# Patient Record
Sex: Female | Born: 1945 | Race: White | Hispanic: No | Marital: Married | Smoking: Never smoker
Health system: Southern US, Community
[De-identification: ages and names within clinical notes are randomized; demographics above are authoritative.]

## PROBLEM LIST (undated history)

## (undated) DIAGNOSIS — F32A Depression, unspecified: Secondary | ICD-10-CM

## (undated) DIAGNOSIS — F329 Major depressive disorder, single episode, unspecified: Secondary | ICD-10-CM

## (undated) DIAGNOSIS — E7800 Pure hypercholesterolemia, unspecified: Secondary | ICD-10-CM

## (undated) DIAGNOSIS — I10 Essential (primary) hypertension: Secondary | ICD-10-CM

## (undated) HISTORY — PX: NO PAST SURGERIES: SHX2092

---

## 2015-03-14 ENCOUNTER — Ambulatory Visit
Admission: EM | Admit: 2015-03-14 | Discharge: 2015-03-14 | Disposition: A | Discharge status: Home / Self Care | Payer: Medicare Other | Attending: Family Medicine | Admitting: Family Medicine

## 2015-03-14 ENCOUNTER — Ambulatory Visit: Payer: Medicare Other

## 2015-03-14 DIAGNOSIS — S62624A Displaced fracture of medial phalanx of right ring finger, initial encounter for closed fracture: Secondary | ICD-10-CM | POA: Insufficient documentation

## 2015-03-14 DIAGNOSIS — M79644 Pain in right finger(s): Secondary | ICD-10-CM | POA: Diagnosis present

## 2015-03-14 DIAGNOSIS — S62604A Fracture of unspecified phalanx of right ring finger, initial encounter for closed fracture: Secondary | ICD-10-CM

## 2015-03-14 DIAGNOSIS — W19XXXA Unspecified fall, initial encounter: Secondary | ICD-10-CM | POA: Insufficient documentation

## 2015-03-14 HISTORY — DX: Essential (primary) hypertension: I10

## 2015-03-14 HISTORY — DX: Major depressive disorder, single episode, unspecified: F32.9

## 2015-03-14 HISTORY — DX: Depression, unspecified: F32.A

## 2015-03-14 MED ORDER — HYDROCODONE-ACETAMINOPHEN 5-325 MG PO TABS: 1.0000 | ORAL_TABLET | Freq: Four times a day (QID) | ORAL | Status: DC | PRN

## 2015-03-14 NOTE — Discharge Instructions (Signed)

## 2015-03-14 NOTE — ED Provider Notes (Signed)
CSN: [id number]     Arrival date & time 03/14/15  1229 History   First MD Initiated Contact with Patient 03/14/15 1313     Chief Complaint  Patient presents with  . Fall  . Finger Pain    (Consider location/radiation/quality/duration/timing/severity/associated sxs/prior Treatment) HPI   Is a 69 year old female who is known to our practice who today was visiting her husband is in rehabilitation from a broken hip. States she tripped over her own feet fell onto the ground taking her right fourth finger forcing it into an ulnarward deviation and sustaining a small abrasion on her volar wrist and over the DIP joint radially of the fourth digit. It has a bandage over the DIP joint cause of "bleeding". In addition she has a bandage over the volar proximal hand from an abrasion. She thinks that the finger deviated ulnarward that isn't entirely sure.  Past Medical History  Diagnosis Date  . Depression   . Hypertension    No past surgical history on file. No family history on file. History  Substance Use Topics  . Smoking status: Never Smoker   . Smokeless tobacco: Not on file  . Alcohol Use: Yes     Comment: Occasionally   OB History    No data available     Review of Systems  All other systems reviewed and are negative.   Allergies  Review of patient's allergies indicates no known allergies.  Home Medications   Prior to Admission medications   Medication Sig Start Date End Date Taking? Authorizing Provider  AMLODIPINE BESYLATE PO Take by mouth.   Yes Historical Provider, MD  ATORVASTATIN CALCIUM PO Take by mouth.   Yes Historical Provider, MD  Escitalopram Oxalate (LEXAPRO PO) Take by mouth.   Yes Historical Provider, MD  LISINOPRIL PO Take by mouth.   Yes Historical Provider, MD  HYDROcodone-acetaminophen (NORCO/VICODIN) 5-325 MG per tablet Take 1 tablet by mouth every 6 (six) hours as needed for severe pain. 03/14/15   Chrissie NoaWilliam P Roemer, PA-C   BP 125/51 mmHg  Pulse 80   Temp(Src) 100 F (37.8 C) (Tympanic)  Resp 18  Ht 5\' 2"  (1.575 m)  Wt 240 lb (108.863 kg)  BMI 43.89 kg/m2  SpO2 98% Physical Exam  Constitutional: She is oriented to person, place, and time. She appears well-developed and well-nourished.  HENT:  Head: Normocephalic and atraumatic.  Eyes: Pupils are equal, round, and reactive to light.  Musculoskeletal:  Examination of the right hand shows no forming the of the fourth digit. There is no ecchymosis or swelling present either. There is a small abrasion/laceration over the radial aspect overlying the DIP joint. She has good FDP and FDS of strength and pull-through. There is mild ulnar collateral ligament laxity compared to the left. There is also a small abrasion just proximal to the hypothenar eminence on the volar surface of the right wrist. Is no other identifiable abnormalities. Is not complaining of any other problems.  Neurological: She is alert and oriented to person, place, and time.  Skin: Skin is warm and dry.  Psychiatric: She has a normal mood and affect. Her behavior is normal. Judgment and thought content normal.  Nursing note and vitals reviewed.   ED Course  Procedures (including critical care time) Labs Review Labs Reviewed - No data to display  Imaging Review Dg Finger Ring Right  03/14/2015   CLINICAL DATA:  Pain and swelling and deformity secondary to trauma due to a fall.  EXAM: RIGHT RING  FINGER 2+V  COMPARISON:  None.  FINDINGS: There is a comminuted displaced fracture of the base of the middle phalanx of the ring finger. The fracture extends through the articular surface and the fracture is impacted.  IMPRESSION: Impacted comminuted fracture of the base of the middle phalanx of the ring finger.   Electronically Signed   By: Francene Boyers M.D.   On: 03/14/2015 13:53     MDM   1. Fracture of phalanx of right ring finger, closed, initial encounter    Discharge Medication List as of 03/14/2015  2:22 PM    START  taking these medications   Details  HYDROcodone-acetaminophen (NORCO/VICODIN) 5-325 MG per tablet Take 1 tablet by mouth every 6 (six) hours as needed for severe pain., Starting 03/14/2015, Until Discontinued, Print       Plan: 1. Test/x-ray results and diagnosis reviewed with patient 2. rx as per orders; risks, benefits, potential side effects reviewed with patient 3. Recommend supportive treatment with elevation,ice 4. F/u ortho hand surgery. The patient has an appointment with the hand surgeon [facility] on Monday. She will elevate her hand as much as possible over the weekend. She may apply ice his as well. I've asked her to use ibuprofen for pain and given her some Vicodin to augment the ibuprofen as necessary. She is given a disc of her x-rays for her appointment with the hand surgeon. A ulnar gutter splint was applied to maintain position. Incorporated the ring and little fingers.    Lutricia Feil, PA-C 03/14/15 1439  Lutricia Feil, PA-C 03/14/15 1440

## 2015-03-14 NOTE — ED Notes (Signed)
Fell on tile at noon today/ Reports pain to the right 4th digit finger. Minor cut on the same finger, abrasion to the wrist.

## 2015-09-24 ENCOUNTER — Encounter: Payer: Self-pay | Admitting: *Deleted

## 2015-09-24 ENCOUNTER — Ambulatory Visit
Admission: EM | Admit: 2015-09-24 | Discharge: 2015-09-24 | Disposition: A | Discharge status: Home / Self Care | Payer: Medicare Other | Attending: Family Medicine | Admitting: Family Medicine

## 2015-09-24 ENCOUNTER — Ambulatory Visit: Payer: Medicare Other

## 2015-09-24 DIAGNOSIS — S82892A Other fracture of left lower leg, initial encounter for closed fracture: Secondary | ICD-10-CM | POA: Diagnosis not present

## 2015-09-24 DIAGNOSIS — Y93A1 Activity, exercise machines primarily for cardiorespiratory conditioning: Secondary | ICD-10-CM | POA: Diagnosis not present

## 2015-09-24 DIAGNOSIS — W19XXXA Unspecified fall, initial encounter: Secondary | ICD-10-CM | POA: Insufficient documentation

## 2015-09-24 DIAGNOSIS — I10 Essential (primary) hypertension: Secondary | ICD-10-CM | POA: Insufficient documentation

## 2015-09-24 DIAGNOSIS — S93401A Sprain of unspecified ligament of right ankle, initial encounter: Secondary | ICD-10-CM | POA: Diagnosis not present

## 2015-09-24 DIAGNOSIS — M25572 Pain in left ankle and joints of left foot: Secondary | ICD-10-CM | POA: Diagnosis present

## 2015-09-24 DIAGNOSIS — F329 Major depressive disorder, single episode, unspecified: Secondary | ICD-10-CM | POA: Insufficient documentation

## 2015-09-24 DIAGNOSIS — S93402A Sprain of unspecified ligament of left ankle, initial encounter: Secondary | ICD-10-CM

## 2015-09-24 MED ORDER — HYDROCODONE-ACETAMINOPHEN 5-325 MG PO TABS: 1.0000 | ORAL_TABLET | Freq: Four times a day (QID) | ORAL | Status: AC | PRN

## 2015-09-24 NOTE — Discharge Instructions (Signed)
Ankle Sprain An ankle sprain is an injury to the strong, fibrous tissues (ligaments) that hold your ankle bones together.  HOME CARE   Put ice on your ankle for 1-2 days or as told by your doctor.  Put ice in a plastic bag.  Place a towel between your skin and the bag.  Leave the ice on for 15-20 minutes at a time, every 2 hours while you are awake.  Only take medicine as told by your doctor.  Raise (elevate) your injured ankle above the level of your heart as much as possible for 2-3 days.  Use crutches if your doctor tells you to. Slowly put your own weight on the affected ankle. Use the crutches until you can walk without pain.  If you have a plaster splint:  Do not rest it on anything harder than a pillow for 24 hours.  Do not put weight on it.  Do not get it wet.  Take it off to shower or bathe.  If given, use an elastic wrap or support stocking for support. Take the wrap off if your toes lose feeling (numb), tingle, or turn cold or blue.  If you have an air splint:  Add or let out air to make it comfortable.  Take it off at night and to shower and bathe.  Wiggle your toes and move your ankle up and down often while you are wearing it. GET HELP IF:  You have rapidly increasing bruising or puffiness (swelling).  Your toes feel very cold.  You lose feeling in your foot.  Your medicine does not help your pain. GET HELP RIGHT AWAY IF:   Your toes lose feeling (numb) or turn blue.  You have severe pain that is increasing. MAKE SURE YOU:   Understand these instructions.  Will watch your condition.  Will get help right away if you are not doing well or get worse.   This information is not intended to replace advice given to you by your health care provider. Make sure you discuss any questions you have with your health care provider.   Document Released: 02/02/2008 Document Revised: 09/06/2014 Document Reviewed: 02/28/2012 Elsevier Interactive Patient  Education 2016 [employer]   Distal fibula non-displaced fracture   [facility] [phone] [address]

## 2015-09-24 NOTE — ED Notes (Signed)
Patient twisted left foot on treadmill. Patient has history of left hip disease.

## 2015-09-24 NOTE — ED Provider Notes (Signed)
CSN: [id number]     Arrival date & time 09/24/15  1245 History   First MD Initiated Contact with Patient 09/24/15 1343     Chief Complaint  Patient presents with  . Foot Pain    left side   (Consider location/radiation/quality/duration/timing/severity/associated sxs/prior Treatment) HPI Comments: 70 yo female presents with left ankle pain after falling off a treadmill this morning. Complains of pain to the outside of the ankle with swelling. Has been applying ice to the area and took some Aleve. 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   The history is provided by the patient.    Past Medical History  Diagnosis Date  . Depression   . Hypertension    History reviewed. No pertinent past surgical history. History reviewed. No pertinent family history. Social History  Substance Use Topics  . Smoking status: Never Smoker   . Smokeless tobacco: None  . Alcohol Use: Yes     Comment: Occasionally   OB History    No data available     Review of Systems  Allergies  Review of patient's allergies indicates no known allergies.  Home Medications   Prior to Admission medications   Medication Sig Start Date End Date Taking? Authorizing Provider  AMLODIPINE BESYLATE PO Take by mouth.   Yes Historical Provider, MD  ATORVASTATIN CALCIUM PO Take by mouth.   Yes Historical Provider, MD  Calcium Carb-Cholecalciferol (CALCIUM 1000 + D PO) Take 1 tablet by mouth daily.   Yes Historical Provider, MD  Escitalopram Oxalate (LEXAPRO PO) Take by mouth.   Yes Historical Provider, MD  LISINOPRIL PO Take by mouth.   Yes Historical Provider, MD  vitamin B-12 (CYANOCOBALAMIN) 500 MCG tablet Take 500 mcg by mouth daily.   Yes Historical Provider, MD  denosumab (PROLIA) 60 MG/ML SOLN injection Inject 60 mg into the skin every 6 (six) months. Administer in upper arm, thigh, or abdomen    Historical Provider, MD  HYDROcodone-acetaminophen (NORCO/VICODIN) 5-325 MG tablet Take 1-2 tablets by mouth every 6 (six)  hours as needed. 09/24/15   Payton Mccallum, MD   Meds Ordered and Administered this Visit  Medications - No data to display  BP 139/83 mmHg  Pulse 98  Temp(Src) 97.5 F (36.4 C) (Oral)  Resp 18  Ht  (1.6 m)  Wt 255 lb (115.667 kg)  BMI 45.18 kg/m2  SpO2 99% No data found.   Physical Exam  Constitutional: She appears well-developed and well-nourished. No distress.  Musculoskeletal:       Left ankle: She exhibits swelling. She exhibits normal range of motion, no ecchymosis, no deformity, no laceration and normal pulse. Tenderness. Lateral malleolus tenderness found. No medial malleolus, no AITFL, no CF ligament, no posterior TFL, no head of 5th metatarsal and no proximal fibula tenderness found. Achilles tendon normal.  Skin: She is not diaphoretic.  Nursing note and vitals reviewed.   ED Course  Procedures (including critical care time)  Labs Review Labs Reviewed - No data to display  Imaging Review Dg Ankle Complete Left  09/24/2015  CLINICAL DATA:  Fall off a treadmill this morning. Left ankle injury and pain. Initial encounter. EXAM: LEFT ANKLE COMPLETE - 3+ VIEW COMPARISON:  None. FINDINGS: Nondisplaced transverse fracture is seen involving the distal fibula just inferior to the level of the tibial plafond. No other fractures are identified. Talus is centered in the ankle mortise. Large plantar calcaneal bone spur noted. IMPRESSION: Nondisplaced transverse fracture of distal fibula. Electronically Signed   By: Jonny Ruiz  Eppie Gibson M.D.   On: 09/24/2015 14:24     Visual Acuity Review  Right Eye Distance:   Left Eye Distance:   Bilateral Distance:    Right Eye Near:   Left Eye Near:    Bilateral Near:         MDM   1. Ankle sprain, left, initial encounter   2. Ankle fracture, left, closed, initial encounter   non-displaced distal fibula fracture, left   New Prescriptions   HYDROCODONE-ACETAMINOPHEN (NORCO/VICODIN) 5-325 MG TABLET    Take 1-2 tablets by mouth every  6 (six) hours as needed.    1. x-ray results and diagnosis reviewed with patient 2. rx as per orders above; reviewed possible side effects, interactions, risks and benefits  3. Foot/ankle immobilized with CAM boot; continue use of walker for support/stability  4. Follow-up with orthopedist within one week    Payton Mccallum, MD 09/24/15 1451

## 2017-12-08 ENCOUNTER — Ambulatory Visit
Admission: EM | Admit: 2017-12-08 | Discharge: 2017-12-08 | Disposition: A | Discharge status: Home / Self Care | Payer: Medicare Other | Attending: Family Medicine | Admitting: Family Medicine

## 2017-12-08 ENCOUNTER — Other Ambulatory Visit: Payer: Self-pay

## 2017-12-08 DIAGNOSIS — M461 Sacroiliitis, not elsewhere classified: Secondary | ICD-10-CM

## 2017-12-08 HISTORY — DX: Pure hypercholesterolemia, unspecified: E78.00

## 2017-12-08 MED ORDER — NAPROXEN 375 MG PO TABS: 375.0000 mg | ORAL_TABLET | Freq: Two times a day (BID) | ORAL | 0 refills | Status: AC

## 2017-12-08 MED ORDER — TIZANIDINE HCL 4 MG PO CAPS: 4.0000 mg | ORAL_CAPSULE | Freq: Three times a day (TID) | ORAL | 0 refills | Status: AC

## 2017-12-08 NOTE — ED Provider Notes (Signed)
[facility]    CSN: [id number] Arrival date & time: 12/08/17  1028     History   Chief Complaint Chief Complaint  Patient presents with  . Back Pain    HPI Melissa Walls is a 72 y.o. female.   HPI  72 year old female presents with symptoms of right-sided sciatica.  She states that it is especially worse in the mornings.  Usually lasts about an hour after awakening and after she moves around, is much less.  She was seen on Sunday in [facility] emergency room she received a diagnosis of sciatica. At that  Time she was placed on Aleve but she was concerned because her primary physician in [place] told her that it may cause gastric bleeding if used in excess.  She indicates that the pain is all right-sided, starts over the right sacroiliac joint and extends into her buttock, posterior thigh, posterior calf to the level of her ankle.  She states that by the time the pain reaches the ankle it is much less.  Seems to be mostly concentrated behind her knee  Not remember any injury that may have precipitated the pain but did spend a great deal of time walking and bending at an antique show a week before. Denies any incontinence.         Past Medical History:  Diagnosis Date  . Depression   . High cholesterol   . Hypertension     There are no active problems to display for this patient.   History reviewed. No pertinent surgical history.  OB History   None      Home Medications    Prior to Admission medications   Medication Sig Start Date End Date Taking? Authorizing Provider  AMLODIPINE BESYLATE PO Take by mouth.    [provider]  ATORVASTATIN CALCIUM PO Take by mouth.    [provider]  Calcium Carb-Cholecalciferol (CALCIUM 1000 + D PO) Take 1 tablet by mouth daily.    [provider]  denosumab (PROLIA) 60 MG/ML SOLN injection Inject 60 mg into the skin every 6 (six) months. Administer in upper arm, thigh, or abdomen     [provider]  Escitalopram Oxalate (LEXAPRO PO) Take by mouth.    [provider]  HYDROcodone-acetaminophen (NORCO/VICODIN) 5-325 MG tablet Take 1-2 tablets by mouth every 6 (six) hours as needed. 09/24/15   Payton Mccallumonty, [place], MD  lisinopril-hydrochlorothiazide (PRINZIDE,ZESTORETIC) 20-12.5 MG tablet Take by mouth.    [provider]  naproxen (NAPROSYN) 375 MG tablet Take 1 tablet (375 mg total) by mouth 2 (two) times daily. 12/08/17   Lutricia Feiloemer, William P, PA-C  tiZANidine (ZANAFLEX) 4 MG capsule Take 1 capsule (4 mg total) by mouth 3 (three) times daily. 12/08/17   Lutricia Feiloemer, William P, PA-C  vitamin B-12 (CYANOCOBALAMIN) 500 MCG tablet Take 500 mcg by mouth daily.    [provider]    Family History History reviewed. No pertinent family history.  Social History Social History   Tobacco Use  . Smoking status: Never Smoker  . Smokeless tobacco: Never Used  Substance Use Topics  . Alcohol use: Yes    Comment: Occasionally  . Drug use: Never     Allergies   Patient has no known allergies.   Review of Systems Review of Systems  Constitutional: Positive for activity change. Negative for chills, fatigue and fever.  Musculoskeletal: Positive for back pain and myalgias.  All other systems reviewed and are negative.    Physical Exam  Triage Vital Signs ED Triage Vitals  Enc Vitals Group     BP 12/08/17 1046 125/62     Pulse Rate 12/08/17 1046 69     Resp 12/08/17 1046 18     Temp 12/08/17 1046 97.6 F (36.4 C)     Temp Source 12/08/17 1046 Oral     SpO2 12/08/17 1046 98 %     Weight 12/08/17 1048 210 lb (95.3 kg)     Height 12/08/17 1048 5\' 2"  (1.575 m)     Head Circumference --      Peak Flow --      Pain Score 12/08/17 1048 4     Pain Loc --      Pain Edu? --      Excl. in GC? --    No data found.  Updated Vital Signs BP 125/62 (BP Location: Left Arm)   Pulse 69   Temp 97.6 F (36.4 C) (Oral)   Resp 18   Ht 5\' 2"  (1.575 m)    Wt 210 lb (95.3 kg)   SpO2 98%   BMI 38.41 kg/m   Visual Acuity Right Eye Distance:   Left Eye Distance:   Bilateral Distance:    Right Eye Near:   Left Eye Near:    Bilateral Near:     Physical Exam  Constitutional: She is oriented to person, place, and time. She appears well-developed and well-nourished. No distress.  HENT:  Head: Normocephalic.  Eyes: Pupils are equal, round, and reactive to light.  Neck: Normal range of motion.  Musculoskeletal: Normal range of motion. She exhibits tenderness.  Examination of the lumbar spine was formed with Herbert Seta, RN as Nurse, children's .she  has surprisingly very limber lumbar range of motion.  She is able to forward flex with her hands to the level of her ankles.  She  assumes erect position without difficulty.  Bilateral lateral flexion is adequate with no complaints to the left but right ward lateral flexion causes her to have pain over the sacroiliac area.  Maximum tenderness is sharply localized over the sacroiliac joint on the right.  Patient is able to toe and heel walk adequately.  Sensation is intact throughout the bilateral lower extremities.  EHL peroneal and anterior tibialis muscles are strong to clinical testing.  DTRs are 2+/4 and bilaterally symmetrical.  Straight leg raise testing in the seated position is negative to 90 degrees bilaterally.  Hip range of motion is full and comfortable.  Neurological: She is alert and oriented to person, place, and time. She displays normal reflexes. No sensory deficit. She exhibits normal muscle tone. Coordination normal.  Skin: Skin is warm and dry. She is not diaphoretic.  Psychiatric: She has a normal mood and affect. Her behavior is normal. Judgment and thought content normal.  Nursing note and vitals reviewed.    UC Treatments / Results  Labs (all labs ordered are listed, but only abnormal results are displayed) Labs Reviewed - No data to display  EKG None Radiology No  results found.  Procedures Procedures (including critical care time)  Medications Ordered in UC Medications - No data to display   Initial Impression / Assessment and Plan / UC Course  I have reviewed the triage vital signs and the nursing notes.  Pertinent labs & imaging results that were available during my care of the patient were reviewed by me and considered in my medical decision making (see chart for details).     Plan: 1. Test/x-ray  results and diagnosis reviewed with patient 2. rx as per orders; risks, benefits, potential side effects reviewed with patient 3. Recommend supportive treatment with symptom avoidance and rest.  Avoid sitting lifting and bending.  Stay Active with ambulation as much as feasible.  Use ice on the sacroiliac area 20 minutes out of every 2 hours 4-5 times daily.  All medications except for what I prescribed.  When taking Naprosyn use Nexium Prilosec or Prevacid.  Always take Naprosyn with food.  Your appointment with a spinal surgeon on Tuesday. 4. F/u prn if symptoms worsen or don't improve   Final Clinical Impressions(s) / UC Diagnoses   Final diagnoses:  Inflammation of right sacroiliac joint [facility])    ED Discharge Orders        Ordered    naproxen (NAPROSYN) 375 MG tablet  2 times daily     12/08/17 1152    tiZANidine (ZANAFLEX) 4 MG capsule  3 times daily     12/08/17 1152       Controlled Substance Prescriptions [place] Controlled Substance Registry consulted? Not Applicable   Lutricia Feil, PA-C 12/08/17 1217

## 2017-12-08 NOTE — ED Triage Notes (Signed)
Pt reports she was seen for same on Sunday at [facility] ER. Diagnosed with sciatica. Reports she is still having sx on right side, especially in the morning. She is concerned because they told her to take Aleve and her PCP cautioned her against taking NSAIDS. Has appt with spine center for f/u on Tuesday

## 2017-12-08 NOTE — Discharge Instructions (Addendum)
Use ice on the area 20 minutes out of every 2 hours 4-5 times daily.  Avoid excessive sitting and no lifting pushing or pulling bending.  Remain active by walking frequently.  Do not take any other medications except for what I have already prescribed for you.  Use Nexium or Prevacid or Prilosec while taking the Naprosyn.  Always take Naprosyn with food

## 2018-10-27 ENCOUNTER — Ambulatory Visit
Admission: EM | Admit: 2018-10-27 | Discharge: 2018-10-27 | Disposition: A | Discharge status: Home / Self Care | Payer: Medicare Other | Attending: Family Medicine | Admitting: Family Medicine

## 2018-10-27 ENCOUNTER — Ambulatory Visit
Admit: 2018-10-27 | Discharge: 2018-10-27 | Disposition: A | Discharge status: Home / Self Care | Payer: Medicare Other | Attending: Family Medicine | Admitting: Family Medicine

## 2018-10-27 ENCOUNTER — Other Ambulatory Visit: Payer: Self-pay

## 2018-10-27 DIAGNOSIS — S0081XA Abrasion of other part of head, initial encounter: Secondary | ICD-10-CM | POA: Diagnosis present

## 2018-10-27 DIAGNOSIS — S0083XA Contusion of other part of head, initial encounter: Secondary | ICD-10-CM

## 2018-10-27 DIAGNOSIS — W0110XA Fall on same level from slipping, tripping and stumbling with subsequent striking against unspecified object, initial encounter: Secondary | ICD-10-CM

## 2018-10-27 DIAGNOSIS — Z23 Encounter for immunization: Secondary | ICD-10-CM | POA: Diagnosis not present

## 2018-10-27 DIAGNOSIS — S0240DA Maxillary fracture, left side, initial encounter for closed fracture: Secondary | ICD-10-CM | POA: Diagnosis present

## 2018-10-27 DIAGNOSIS — S0240CA Maxillary fracture, right side, initial encounter for closed fracture: Secondary | ICD-10-CM | POA: Diagnosis present

## 2018-10-27 DIAGNOSIS — S0240AA Malar fracture, right side, initial encounter for closed fracture: Secondary | ICD-10-CM | POA: Diagnosis not present

## 2018-10-27 MED ORDER — TETANUS-DIPHTHERIA TOXOIDS TD 5-2 LFU IM INJ
0.5000 mL | INJECTION | Freq: Once | INTRAMUSCULAR | Status: AC
  Administered 2018-10-27: 0.5 mL via INTRAMUSCULAR

## 2018-10-27 MED ORDER — AMOXICILLIN-POT CLAVULANATE 875-125 MG PO TABS: 1.0000 | ORAL_TABLET | Freq: Two times a day (BID) | ORAL | 0 refills | Status: AC

## 2018-10-27 NOTE — Discharge Instructions (Signed)
Tylenol as needed. Take medication as prescribed. Apply ice. Rest. Drink plenty of fluids.   Follow up with Ear Nose and throat as discussed.   Follow up with your primary care physician this week for follow up. Return to Urgent care for new or worsening concerns.

## 2018-10-27 NOTE — ED Provider Notes (Signed)
[facility] ____________________________________________  Time seen: Approximately 9:44 AM  I have reviewed the triage vital signs and the nursing notes.   HISTORY  Chief Complaint Fall and Facial Pain  HPI Koda Gunnoe is a 73 y.o. female presenting with husband at bedside for evaluation post fall.  Reports approximately an hour ago she was walking into [employer], but states that she accidentally tripped falling forward.  States that she fell from standing directly to the pavement hitting her face and head.  Denies loss of consciousness.  States some pain to forehead and nose and lip currently.  Denies any vision changes, syncope, near-syncope, paresthesias, unsteady gait, weakness, vomiting, chest pain, shortness of breath.  States felt completely fine prior to falling and fell only because of tripping.  Unsure of last tetanus immunization.  Denies dental injury.  Has continued to ambulate well.  Not on anticoagulants or aspirin.  States has slight tenderness to the left side of her hand, but states very mild.  Denies any other pain or injuries.  Denies pain to neck or back.  Sharilyn Sites, MD: PCP   Past Medical History:  Diagnosis Date  . Depression   . High cholesterol   . Hypertension     There are no active problems to display for this patient.   Past Surgical History:  Procedure Laterality Date  . NO PAST SURGERIES       No current facility-administered medications for this encounter.   Current Outpatient Medications:  .  AMLODIPINE BESYLATE PO, Take by mouth., Disp: , Rfl:  .  ATORVASTATIN CALCIUM PO, Take by mouth., Disp: , Rfl:  .  Calcium Carb-Cholecalciferol (CALCIUM 1000 + D PO), Take 1 tablet by mouth daily., Disp: , Rfl:  .  denosumab (PROLIA) 60 MG/ML SOLN injection, Inject 60 mg into the skin every 6 (six) months. Administer in upper arm, thigh, or abdomen, Disp: , Rfl:  .  Escitalopram Oxalate (LEXAPRO PO), Take by mouth., Disp: , Rfl:    .  lisinopril-hydrochlorothiazide (PRINZIDE,ZESTORETIC) 20-12.5 MG tablet, Take by mouth., Disp: , Rfl:  .  amoxicillin-clavulanate (AUGMENTIN) 875-125 MG tablet, Take 1 tablet by mouth every 12 (twelve) hours., Disp: 20 tablet, Rfl: 0 .  HYDROcodone-acetaminophen (NORCO/VICODIN) 5-325 MG tablet, Take 1-2 tablets by mouth every 6 (six) hours as needed., Disp: 8 tablet, Rfl: 0 .  naproxen (NAPROSYN) 375 MG tablet, Take 1 tablet (375 mg total) by mouth 2 (two) times daily., Disp: 20 tablet, Rfl: 0 .  tiZANidine (ZANAFLEX) 4 MG capsule, Take 1 capsule (4 mg total) by mouth 3 (three) times daily., Disp: 21 capsule, Rfl: 0 .  vitamin B-12 (CYANOCOBALAMIN) 500 MCG tablet, Take 500 mcg by mouth daily., Disp: , Rfl:   Allergies Patient has no known allergies.  History reviewed. No pertinent family history.  Social History Social History   Tobacco Use  . Smoking status: Never Smoker  . Smokeless tobacco: Never Used  Substance Use Topics  . Alcohol use: Yes    Comment: Occasionally  . Drug use: Never    Review of Systems Constitutional: No fever Eyes: No visual changes. ENT: No sore throat. Cardiovascular: Denies chest pain. Respiratory: Denies shortness of breath. Gastrointestinal: No abdominal pain.  No nausea, no vomiting.   Musculoskeletal: Some chronic low back pain.  Denies any acute low back pain or neck pain. Skin: Negative for rash. Neurological: Negative for focal weakness or numbness.   ____________________________________________   PHYSICAL EXAM:  VITAL SIGNS: ED Triage Vitals  Enc Vitals Group     BP 10/27/18 0847 (!) 131/55     Pulse Rate 10/27/18 0847 73     Resp 10/27/18 0847 18     Temp 10/27/18 0847 98.2 F (36.8 C)     Temp Source 10/27/18 0847 Oral     SpO2 10/27/18 0847 98 %     Weight 10/27/18 0844 220 lb (99.8 kg)     Height 10/27/18 0844 5\' 1"  (1.549 m)     Head Circumference --      Peak Flow --      Pain Score 10/27/18 0844 1     Pain Loc --       Pain Edu? --      Excl. in GC? --     Constitutional: Alert and oriented. Well appearing and in no acute distress. Eyes: Conjunctivae are normal. PERRL. EOMI. ENT      Head: Normocephalic.  Mild superficial abrasion to forehead.      Nose: No congestion.  Mid nasal bridge superficial abrasions, mild localized edema, mild to moderate tenderness to direct palpation.  Bilateral nares patent.      Mouth/Throat: Mucous membranes are moist.Oropharynx non-erythematous.  Left upper lip mild to moderate edema with mild induration and superficial abrasions, no laceration, no bleeding, mild tenderness to direct palpation.  Neck: No stridor. Supple without meningismus.  Hematological/Lymphatic/Immunilogical: No cervical lymphadenopathy. Cardiovascular: Normal rate, regular rhythm. Grossly normal heart sounds.  Good peripheral circulation. Respiratory: Normal respiratory effort without tachypnea nor retractions. Breath sounds are clear and equal bilaterally. No wheezes, rales, rhonchi. Gastrointestinal: Soft and nontender.  Musculoskeletal:  Nontender with normal range of motion in all extremities. No midline cervical, thoracic or lumbar tenderness to palpation.  Except: Left lateral proximal hand minimal tenderness to direct palpation, slight ecchymosis, minimal bony tenderness, full range of motion present, bilateral hand grip strong and equal, left upper extremity otherwise nontender. Neurologic:  Normal speech and language. No gross focal neurologic deficits are appreciated. Speech is normal. No gait instability.  Negative pronator drift.  No ataxia.  No paresthesias. Skin:  Skin is warm, dry and intact. No rash noted. Psychiatric: Mood and affect are normal. Speech and behavior are normal. Patient exhibits appropriate insight and judgment   ___________________________________________   LABS (all labs ordered are listed, but only abnormal results are displayed)  Labs Reviewed - No data to  display  RADIOLOGY  Ct Head Wo Contrast  Result Date: 10/27/2018 CLINICAL DATA:  Pain following fall EXAM: CT HEAD WITHOUT CONTRAST CT MAXILLOFACIAL WITHOUT CONTRAST TECHNIQUE: Multidetector CT imaging of the head and maxillofacial structures were performed using the standard protocol without intravenous contrast. Multiplanar CT image reconstructions of the maxillofacial structures were also generated. COMPARISON:  None. FINDINGS: CT HEAD FINDINGS Brain: There is mild diffuse atrophy. There is no intracranial mass, hemorrhage, extra-axial fluid collection, or midline shift. There is evidence of patchy periventricular small vessel disease in the centra semiovale bilaterally. There is evidence of prior infarct involving a portion of the right external and internal capsules anteriorly as well as in the anterior lentiform nucleus on the right. No acute infarct is evident. Vascular: There is no hyperdense vessel. There is calcification in each carotid siphon region. Skull: Bony calvarium appears intact. Other: Mastoid air cells are clear. CT MAXILLOFACIAL FINDINGS Osseous: There is a comminuted fracture of the midportion of the lateral left maxillary antrum. There is a subtle nondisplaced fracture of the lateral mid right maxillary antrum. There is a nondisplaced fracture  of the anterior right maxillary wall. No other fractures are appreciable. No dislocations. No blastic or lytic bone lesions. There is erosive arthropathy involving each mandibular condyle. Orbits: Orbits appear symmetric bilaterally. No intraorbital lesions are evident. Sinuses: There is extensive opacification in each maxillary antrum. There is mucosal thickening in several ethmoid air cells. No bony destruction or expansion. There is opacification in the right ostiomeatal unit complex region. The ostiomeatal unit complex on the left is patent. There is rightward deviation of the nasal septum. There is no nares obstruction. Soft tissues: There is  mild soft tissue swelling over the mid face. No well-defined soft tissue hematoma. No abscess. No adenopathy evident. Visualized salivary glands appear normal. Tongue and tongue base regions appear normal. Visualize pharynx appears normal. There is mild degenerative change in the visualized cervical spine. IMPRESSION: CT head: Mild diffuse atrophy with periventricular small vessel disease. 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 No acute infarct is evident. No mass or hemorrhage. CT maxillofacial: 1. Comminuted fracture midportion lateral wall of the left maxillary antrum. 2. Nondisplaced fractures of the anterior and right lateral maxillary antrum. 3.  No dislocations.  No blastic or lytic bone lesions. 4. Maxillary and ethmoid sinus disease. Obstruction of the right ostiomeatal unit complex. Rightward deviation of the nasal septum. 5. 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 Electronically Signed   By: Bretta BangWilliam  Woodruff III M.D.   On: 10/27/2018 11:19   Ct Maxillofacial Wo Contrast  Result Date: 10/27/2018 CLINICAL DATA:  Pain following fall EXAM: CT HEAD WITHOUT CONTRAST CT MAXILLOFACIAL WITHOUT CONTRAST TECHNIQUE: Multidetector CT imaging of the head and maxillofacial structures were performed using the standard protocol without intravenous contrast. Multiplanar CT image reconstructions of the maxillofacial structures were also generated. COMPARISON:  None. FINDINGS: CT HEAD FINDINGS Brain: There is mild diffuse atrophy. There is no intracranial mass, hemorrhage, extra-axial fluid collection, or midline shift. There is evidence of patchy periventricular small vessel disease in the centra semiovale bilaterally. There is evidence of prior infarct involving a portion of the right external and internal capsules anteriorly as well as in the anterior lentiform nucleus on the right. No acute  infarct is evident. Vascular: There is no hyperdense vessel. There is calcification in each carotid siphon region. Skull: Bony calvarium appears intact. Other: Mastoid air cells are clear. CT MAXILLOFACIAL FINDINGS Osseous: There is a comminuted fracture of the midportion of the lateral left maxillary antrum. There is a subtle nondisplaced fracture of the lateral mid right maxillary antrum. There is a nondisplaced fracture of the anterior right maxillary wall. No other fractures are appreciable. No dislocations. No blastic or lytic bone lesions. There is erosive arthropathy involving each mandibular condyle. Orbits: Orbits appear symmetric bilaterally. No intraorbital lesions are evident. Sinuses: There is extensive opacification in each maxillary antrum. There is mucosal thickening in several ethmoid air cells. No bony destruction or expansion. There is opacification in the right ostiomeatal unit complex region. The ostiomeatal unit complex on the left is patent. There is rightward deviation of the nasal septum. There is no nares obstruction. Soft tissues: There is mild soft tissue swelling over the mid face. No well-defined soft tissue hematoma. No abscess. No adenopathy evident. Visualized salivary glands appear normal. Tongue and tongue base regions appear normal. Visualize pharynx appears normal. There is mild degenerative change in the visualized cervical spine. IMPRESSION: CT head: Mild diffuse atrophy with periventricular small vessel disease. Prior  infarcts in the right anterior internal and external capsules and right anterior lentiform nucleus. No acute infarct is evident. No mass or hemorrhage. CT maxillofacial: 1. Comminuted fracture midportion lateral wall of the left maxillary antrum. 2. Nondisplaced fractures of the anterior and right lateral maxillary antrum. 3.  No dislocations.  No blastic or lytic bone lesions. 4. Maxillary and ethmoid sinus disease. Obstruction of the right ostiomeatal unit  complex. Rightward deviation of the nasal septum. 5. 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 Electronically Signed   By: Bretta Bang III M.D.   On: 10/27/2018 11:19   ____________________________________________   PROCEDURES Procedures    INITIAL IMPRESSION / ASSESSMENT AND PLAN / ED COURSE  Pertinent labs & imaging results that were available during my care of the patient were reviewed by me and considered in my medical decision making (see chart for details).  Overall well-appearing patient.  No acute distress.  Fall from standing after tripping with direct facial and head injury on concrete.  Will evaluate CT head and maxillofacial.  Tetanus immunization updated.  CT results as above per radiologist, showing mild diffuse atrophy, prior infarcts, no acute infarct, no mass or hemorrhage.  CT maxillofacial radiologist comminuted fracture midportion lateral wall of left maxillary antrum, nondisplaced fractures of the anterior and right lateral maxillary antrum, no dislocations, maxillary and ethmoid sinus disease.  Called and discussed these results with ENT on-call Dr. Willeen Cass.  Further spoke with radiology, and verified no Jerry Caras fracture.  Counseled again with Dr. Willeen Cass who at this time reviewed imaging and did not visualize same fractures, and does not recommend any further evaluation or intervention at this time, and will follow up with patient next week.  Information given to follow-up with ENT next week.  Will treat with oral Augmentin for sinus disease.  Encourage supportive care, rest, ice, Tylenol as needed.  Discussed these findings in details in detail with patient and spouse who verbalized understanding and agreed to plan.Discussed indication, risks and benefits of medications with patient.  Discussed follow up with Primary care physician this week. Discussed follow up and return parameters including no resolution or  any worsening concerns. Patient verbalized understanding and agreed to plan.   ____________________________________________   FINAL CLINICAL IMPRESSION(S) / ED DIAGNOSES  Final diagnoses:  Maxillary fracture, left side, initial encounter for closed fracture [facility])  Maxillary fracture, right side, initial encounter for closed fracture (HCC)  Contusion of face, initial encounter  Abrasion of face, initial encounter     ED Discharge Orders         Ordered    amoxicillin-clavulanate (AUGMENTIN) 875-125 MG tablet  Every 12 hours     10/27/18 1230           Note: This dictation was prepared with Dragon dictation along with smaller phrase technology. Any transcriptional errors that result from this process are unintentional.         Renford Dills, NP 10/27/18 1309

## 2018-10-27 NOTE — ED Triage Notes (Signed)
Patient complains of a fall that occurred while going in [employer] this morning. States that she fell over a curb and landed on her face. Patient has lip pain and swelling and abrasion to nose. Patient states that left hand hurts some as well.

## 2019-03-28 IMAGING — CT CT HEAD W/O CM
2 of 3 series · 15 of 30 positions shown, 17 images · non-contrast
Comparison: None.

CLINICAL DATA: Pain following fall

EXAM:
CT HEAD WITHOUT CONTRAST
CT MAXILLOFACIAL WITHOUT CONTRAST
TECHNIQUE: Multidetector CT imaging of the head and maxillofacial structures
were performed using the standard protocol without intravenous
contrast. Multiplanar CT image reconstructions of the maxillofacial
structures were also generated.

[Series 3: ax soft · axial · 0.29mm/px · z∈[-188,-72]mm · 8 of 76 slices shown, 10 images]
[im 9/76  brain]
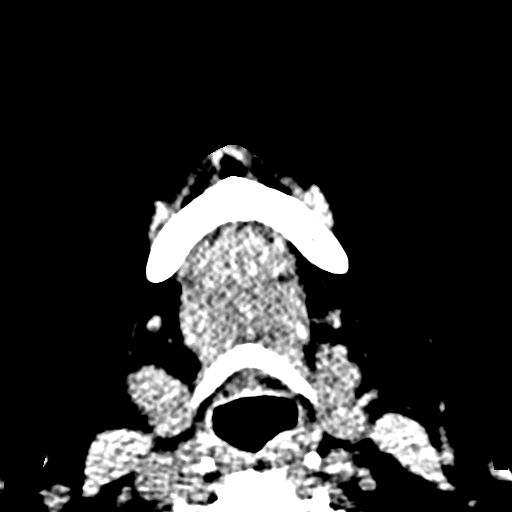
[im 9/76  bone]
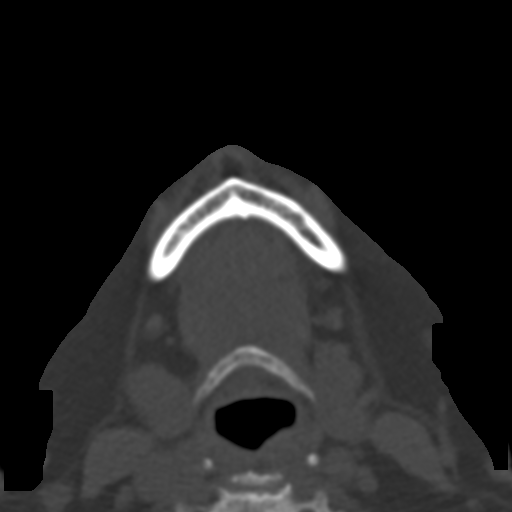
[im 17/76  brain]
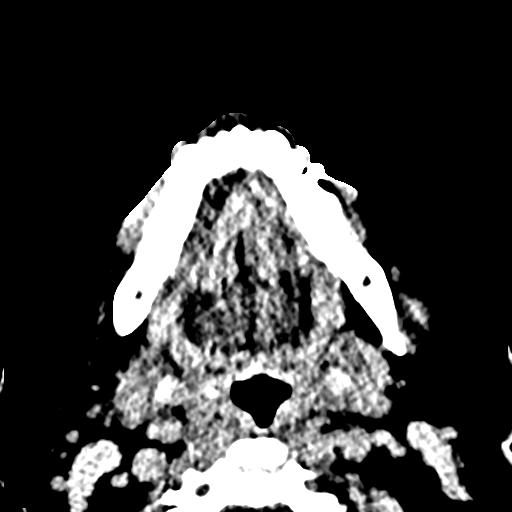
[im 26/76  brain]
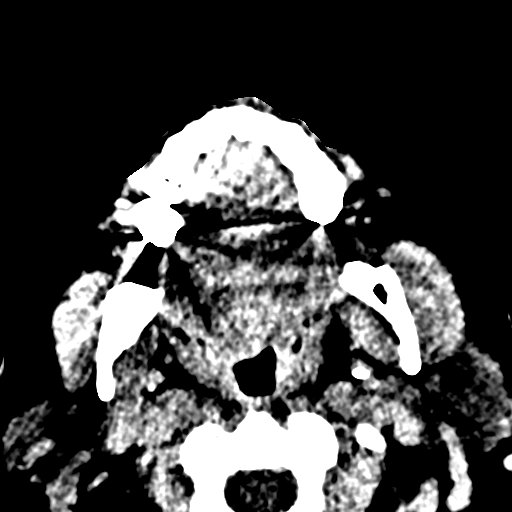
[im 34/76  brain]
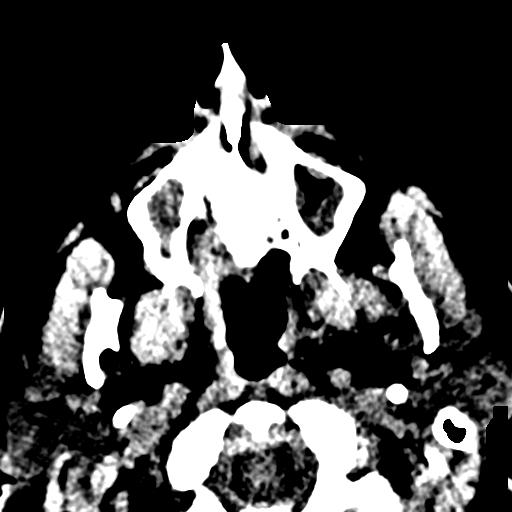
[im 42/76  brain]
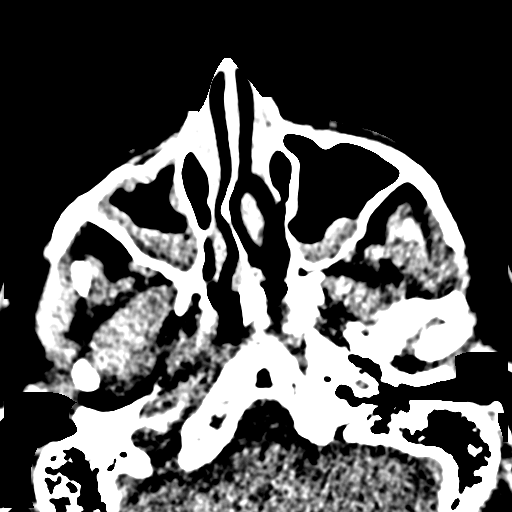
[im 42/76  bone]
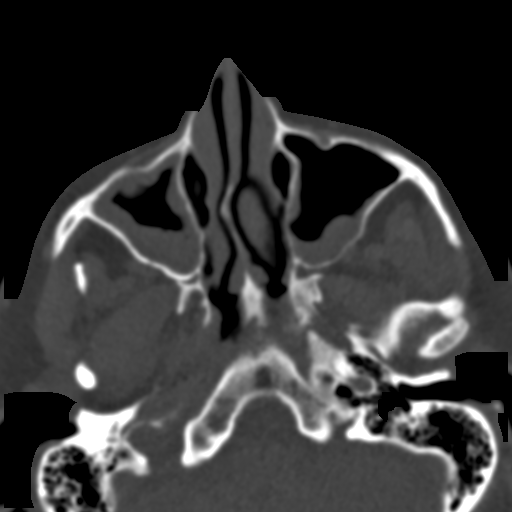
[im 51/76  brain]
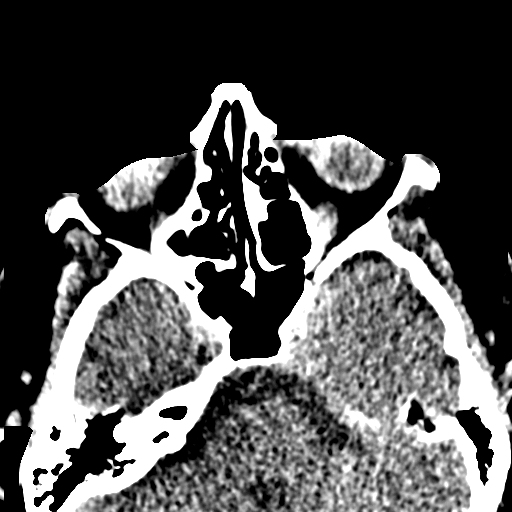
[im 59/76  brain]
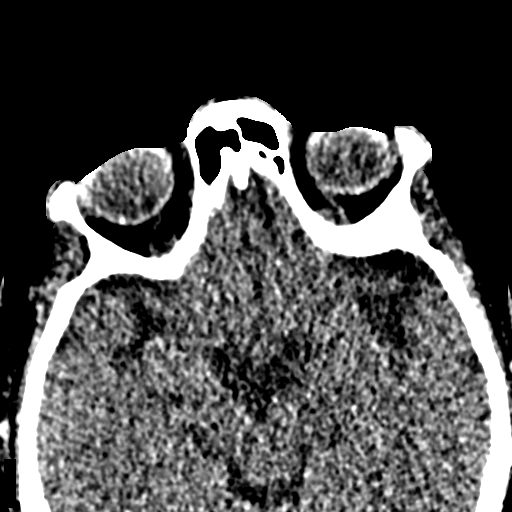
[im 67/76  brain]
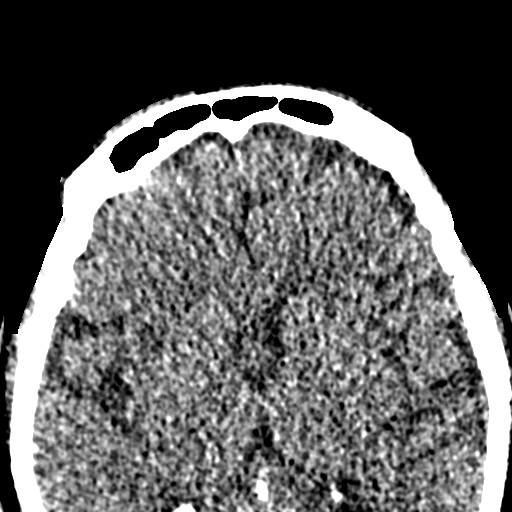

[Series 5: sagittal bone · axial · 0.29mm/px · z∈[-183,-83]mm · 7 of 76 slices shown]
[im 9/76  bone]
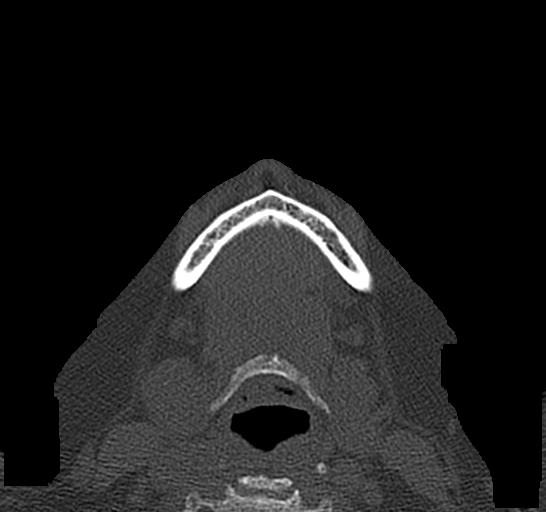
[im 17/76  bone]
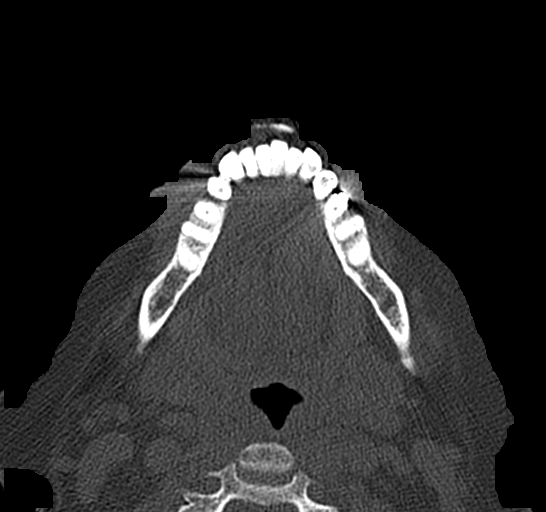
[im 26/76  bone]
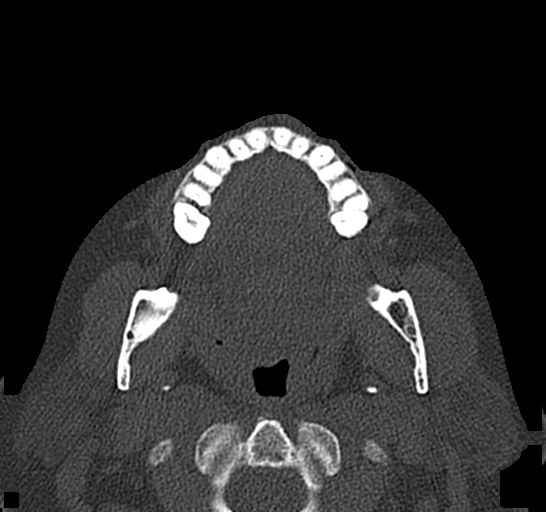
[im 34/76  bone]
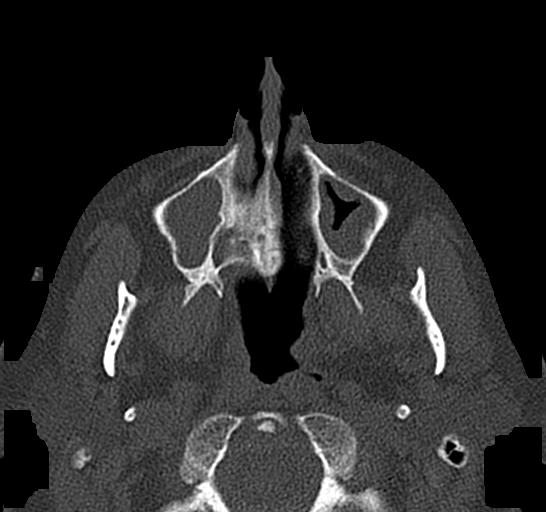
[im 42/76  bone]
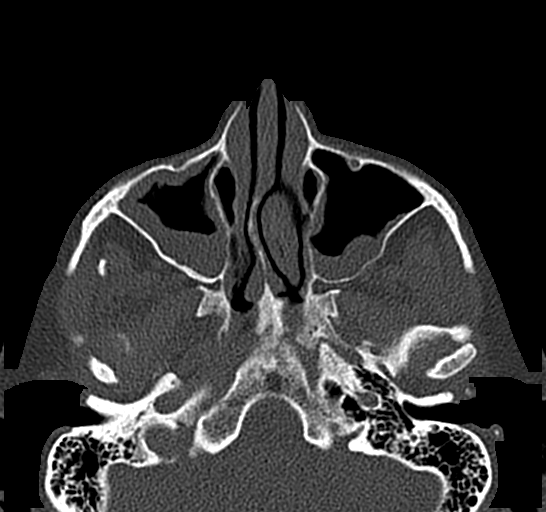
[im 51/76  bone]
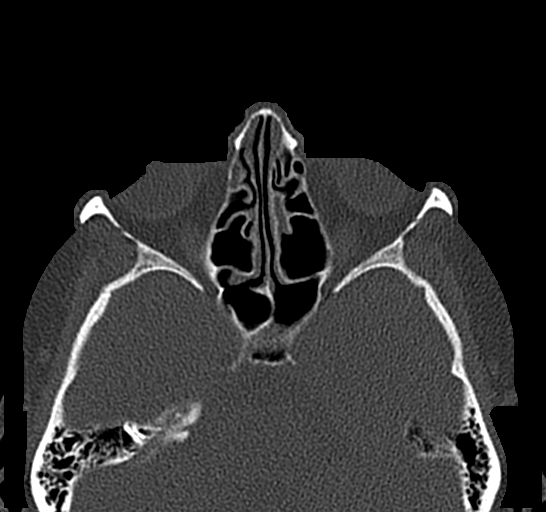
[im 59/76  bone]
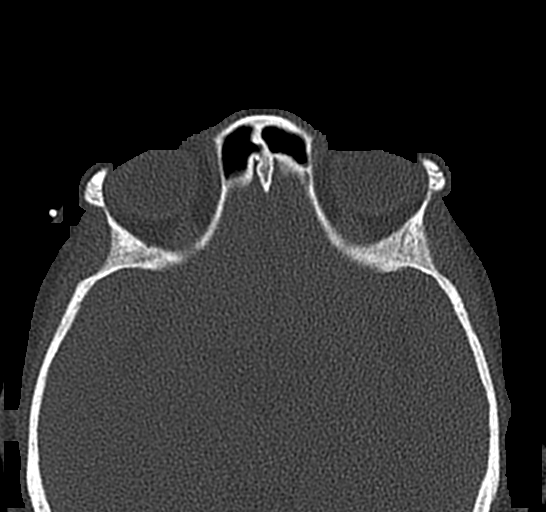

[15 of 30 positions shown; findings below may reference images not displayed]

FINDINGS: CT HEAD FINDINGS

Brain: There is mild diffuse atrophy. There is no intracranial mass,
hemorrhage, extra-axial fluid collection, or midline shift. There is
evidence of patchy periventricular small vessel disease in the
centra semiovale bilaterally. There is evidence of prior infarct
involving a portion of the right external and internal capsules
anteriorly as well as in the anterior lentiform nucleus on the
right. No acute infarct is evident.

Vascular: There is no hyperdense vessel. There is calcification in
each carotid siphon region.

Skull: Bony calvarium appears intact.

Other: Mastoid air cells are clear.

CT MAXILLOFACIAL FINDINGS

Osseous: There is a comminuted fracture of the midportion of the
lateral left maxillary antrum. There is a subtle nondisplaced
fracture of the lateral mid right maxillary antrum. There is a
nondisplaced fracture of the anterior right maxillary wall. No other
fractures are appreciable. No dislocations. No blastic or lytic bone
lesions. There is erosive arthropathy involving each mandibular
condyle.

Orbits: Orbits appear symmetric bilaterally. No intraorbital lesions
are evident.

Sinuses: There is extensive opacification in each maxillary antrum.
There is mucosal thickening in several ethmoid air cells. No bony
destruction or expansion. There is opacification in the right
ostiomeatal unit complex region. The ostiomeatal unit complex on the
left is patent. There is rightward deviation of the nasal septum.
There is no nares obstruction.

Soft tissues: There is mild soft tissue swelling over the mid face.
No well-defined soft tissue hematoma. No abscess. No adenopathy
evident. Visualized salivary glands appear normal. Tongue and tongue
base regions appear normal. Visualize pharynx appears normal. There
is mild degenerative change in the visualized cervical spine.
IMPRESSION: CT head: Mild diffuse atrophy with periventricular small vessel
disease. Prior infarcts in the right anterior internal and external
capsules and right anterior lentiform nucleus. No acute infarct is
evident. No mass or hemorrhage.

CT maxillofacial:

1. Comminuted fracture midportion lateral wall of the left maxillary
antrum.

2. Nondisplaced fractures of the anterior and right lateral
maxillary antrum.

3.  No dislocations.  No blastic or lytic bone lesions.

4. Maxillary and ethmoid sinus disease. Obstruction of the right
ostiomeatal unit complex. Rightward deviation of the nasal septum.

5. Advanced arthropathy in each temporomandibular joint with erosive
change in each mandibular condyle.
